# Patient Record
Sex: Female | Born: 1975 | Race: White | Hispanic: No | Marital: Married | Smoking: Never smoker
Health system: Southern US, Community
[De-identification: ages and names within clinical notes are randomized; demographics above are authoritative.]

## PROBLEM LIST (undated history)

## (undated) DIAGNOSIS — J320 Chronic maxillary sinusitis: Secondary | ICD-10-CM

## (undated) DIAGNOSIS — J45909 Unspecified asthma, uncomplicated: Secondary | ICD-10-CM

## (undated) DIAGNOSIS — R0602 Shortness of breath: Secondary | ICD-10-CM

## (undated) HISTORY — DX: Chronic maxillary sinusitis: J32.0

## (undated) HISTORY — DX: Shortness of breath: R06.02

## (undated) HISTORY — DX: Unspecified asthma, uncomplicated: J45.909

---

## 2005-03-28 ENCOUNTER — Ambulatory Visit: Payer: Self-pay | Admitting: Internal Medicine

## 2015-03-24 ENCOUNTER — Encounter: Payer: Self-pay | Admitting: Unknown Physician Specialty

## 2015-03-24 ENCOUNTER — Ambulatory Visit (INDEPENDENT_AMBULATORY_CARE_PROVIDER_SITE_OTHER): Payer: BLUE CROSS/BLUE SHIELD | Admitting: Unknown Physician Specialty

## 2015-03-24 VITALS — BP 125/74 | HR 72 | Temp 97.8°F | Ht 64.0 in | Wt 152.0 lb

## 2015-03-24 DIAGNOSIS — J45909 Unspecified asthma, uncomplicated: Secondary | ICD-10-CM | POA: Insufficient documentation

## 2015-03-24 DIAGNOSIS — J019 Acute sinusitis, unspecified: Secondary | ICD-10-CM | POA: Diagnosis not present

## 2015-03-24 DIAGNOSIS — J45902 Unspecified asthma with status asthmaticus: Secondary | ICD-10-CM

## 2015-03-24 DIAGNOSIS — Z23 Encounter for immunization: Secondary | ICD-10-CM

## 2015-03-24 DIAGNOSIS — R05 Cough: Secondary | ICD-10-CM | POA: Diagnosis not present

## 2015-03-24 DIAGNOSIS — R059 Cough, unspecified: Secondary | ICD-10-CM | POA: Insufficient documentation

## 2015-03-24 DIAGNOSIS — J329 Chronic sinusitis, unspecified: Secondary | ICD-10-CM | POA: Insufficient documentation

## 2015-03-24 DIAGNOSIS — R0602 Shortness of breath: Secondary | ICD-10-CM

## 2015-03-24 DIAGNOSIS — J320 Chronic maxillary sinusitis: Secondary | ICD-10-CM | POA: Insufficient documentation

## 2015-03-24 MED ORDER — BENZONATATE 100 MG PO CAPS: 100.0000 mg | ORAL_CAPSULE | Freq: Two times a day (BID) | ORAL | Status: DC | PRN

## 2015-03-24 MED ORDER — AMOXICILLIN-POT CLAVULANATE 875-125 MG PO TABS: 1.0000 | ORAL_TABLET | Freq: Two times a day (BID) | ORAL | Status: DC

## 2015-03-24 MED ORDER — PREDNISONE 20 MG PO TABS: 20.0000 mg | ORAL_TABLET | Freq: Every day | ORAL | Status: DC

## 2015-03-24 NOTE — Assessment & Plan Note (Signed)
Prednisone  for five days.

## 2015-03-24 NOTE — Assessment & Plan Note (Signed)
Tessalon perles to improve cough.

## 2015-03-24 NOTE — Progress Notes (Signed)
BP 125/74 mmHg  Pulse 72  Temp(Src) 97.8 F (36.6 C)  Ht  (1.626 m)  Wt 152 lb (68.947 kg)  BMI 26.08 kg/m2  SpO2 97%  LMP  (LMP Unknown)   Subjective:    Patient ID: Maria Bartlett, female    DOB: 03-31-1975, 40 y.o.   MRN: [id number]  HPI: Maria Bartlett is a 40 y.o. female  Chief Complaint  Patient presents with  . Cough    pt states she has had a cough for over a month now. Pt states she has tried taking OTC meducations but nothing has helped   Cough: non productive. Feels like has drainage "under ears." Steam room at gym improves symptoms for approx 24 hours. Not tired. Been around niece who has had ear infections and cough over Christmas. Feels like when she gets hot, cough is worse. Notices more when she is talking a lot, but otherwise no changes during day. Taken tylenol Sinus, Delsum, and Proair as needed with no improvements. Feels like can't take full breath.    Relevant past medical, surgical, family and social history reviewed and updated as indicated. Interim medical history since our last visit reviewed. Allergies and medications reviewed and updated.  Review of Systems  Constitutional: Negative.  Negative for fever.  HENT: Negative for congestion, rhinorrhea, sneezing and sore throat.   Eyes: Negative.   Respiratory: Positive for cough. Negative for chest tightness and shortness of breath.   Cardiovascular: Negative.   Gastrointestinal: Negative.   Endocrine: Negative.   Genitourinary: Negative.   Musculoskeletal: Negative.   Skin: Negative.   Allergic/Immunologic: Negative.   Neurological: Negative.   Hematological: Negative.   Psychiatric/Behavioral: Negative.     Per HPI unless specifically indicated above     Objective:    BP 125/74 mmHg  Pulse 72  Temp(Src) 97.8 F (36.6 C)  Ht  (1.626 m)  Wt 152 lb (68.947 kg)  BMI 26.08 kg/m2  SpO2 97%  LMP  (LMP Unknown)  Wt Readings from Last 3 Encounters:  03/24/15 152 lb (68.947 kg)   01/21/14 151 lb (68.493 kg)    Physical Exam  Constitutional: She is oriented to person, place, and time. She appears well-developed and well-nourished. No distress.  HENT:  Head: Normocephalic and atraumatic.  Right Ear: Tympanic membrane and ear canal normal.  Left Ear: Tympanic membrane and ear canal normal.  Nose: No rhinorrhea. Right sinus exhibits no maxillary sinus tenderness and no frontal sinus tenderness. Left sinus exhibits no maxillary sinus tenderness and no frontal sinus tenderness.  Eyes: Conjunctivae and lids are normal. Right eye exhibits no discharge. Left eye exhibits no discharge. No scleral icterus.  Cardiovascular: Normal rate and regular rhythm.   Pulmonary/Chest: Effort normal and breath sounds normal. No respiratory distress.  Abdominal: Normal appearance. There is no splenomegaly or hepatomegaly.  Musculoskeletal: Normal range of motion.  Neurological: She is alert and oriented to person, place, and time.  Skin: Skin is intact. No rash noted. No pallor.  Psychiatric: She has a normal mood and affect. Her behavior is normal. Judgment and thought content normal.    No results found for this or any previous visit.    Assessment & Plan:   Problem List Items Addressed This Visit      Unprioritized   Cough    Tessalon perles to improve cough.       Sinusitis     Prednisone  for five days.  Relevant Medications   predniSONE (DELTASONE) 20 MG tablet   amoxicillin-clavulanate (AUGMENTIN) 875-125 MG tablet   benzonatate (TESSALON) 100 MG capsule    Other Visit Diagnoses    Need for diphtheria-tetanus-pertussis (Tdap) vaccine, adult/adolescent    -  Primary    Relevant Orders    Tdap vaccine greater than or equal to 7yo IM (Completed)        Follow up plan: Return if symptoms worsen or fail to improve.

## 2015-04-07 ENCOUNTER — Other Ambulatory Visit: Payer: Self-pay | Admitting: Unknown Physician Specialty

## 2015-04-07 ENCOUNTER — Ambulatory Visit
Admission: RE | Admit: 2015-04-07 | Discharge: 2015-04-07 | Disposition: A | Discharge status: Home / Self Care | Payer: BLUE CROSS/BLUE SHIELD | Source: Ambulatory Visit | Attending: Unknown Physician Specialty | Admitting: Unknown Physician Specialty

## 2015-04-07 DIAGNOSIS — R05 Cough: Secondary | ICD-10-CM

## 2015-04-07 DIAGNOSIS — R058 Other specified cough: Secondary | ICD-10-CM

## 2017-03-22 ENCOUNTER — Ambulatory Visit
Admission: RE | Admit: 2017-03-22 | Discharge: 2017-03-22 | Disposition: A | Discharge status: Home / Self Care | Payer: BLUE CROSS/BLUE SHIELD | Source: Ambulatory Visit | Attending: Unknown Physician Specialty | Admitting: Unknown Physician Specialty

## 2017-03-22 ENCOUNTER — Other Ambulatory Visit: Payer: Self-pay | Admitting: Unknown Physician Specialty

## 2017-03-22 DIAGNOSIS — R05 Cough: Secondary | ICD-10-CM

## 2017-03-22 DIAGNOSIS — R059 Cough, unspecified: Secondary | ICD-10-CM

## 2017-05-08 ENCOUNTER — Encounter: Payer: Self-pay | Admitting: Internal Medicine

## 2017-05-08 ENCOUNTER — Ambulatory Visit: Payer: BLUE CROSS/BLUE SHIELD | Admitting: Internal Medicine

## 2017-05-08 ENCOUNTER — Institutional Professional Consult (permissible substitution): Payer: BLUE CROSS/BLUE SHIELD | Admitting: Internal Medicine

## 2017-05-08 VITALS — BP 118/62 | HR 92 | Ht 64.0 in | Wt 161.0 lb

## 2017-05-08 DIAGNOSIS — J4520 Mild intermittent asthma, uncomplicated: Secondary | ICD-10-CM

## 2017-05-08 MED ORDER — FLUTICASONE PROPIONATE HFA 220 MCG/ACT IN AERO
2.0000 | INHALATION_SPRAY | Freq: Two times a day (BID) | RESPIRATORY_TRACT | 2 refills | Status: DC
Start: 2017-05-08 — End: 2021-03-15

## 2017-05-08 NOTE — Patient Instructions (Signed)
FLOVENT 220 MCg 2 puffs twice daily when you get sick  Zyrtec 10 mg at night when you feel sick  Albuterol as needed 2-4 puffs every 4-6jhrs as needed for cough  START OMPERAZOLE daily  Check PFT's  Follow up in 2 months

## 2017-05-08 NOTE — Progress Notes (Signed)
Name: Maria Bartlett MRN: [id number] DOB: 06/05/1975     CONSULTATION DATE: 3.19.19 REFERRING MD : Jenne CampusmcQueen  CHIEF COMPLAINT: cough  STUDIES:  CXR independently reviewed -1.31.19 No acute process, no pneumonia, no opacities, no effusions   HISTORY OF PRESENT ILLNESS: 42 yo pleasant White female seen today for cough It seems that this is chronic in nature described as nonproductive This is associated with signs and symptoms of acute viral illness with chest congestion and nasal congestion Patient states she is sick with a chest cold and nasal congestion almost twice a year for her whole life Patient states that she has had her cough her whole life She is a non-smoker however she has been exposed to her father secondhand smoke exposure for 20 years  Patient also has signs symptoms of gas through esophageal reflux disease and has had acid reflux therapy in the past and it has helped her cough and she continues to have acid reflux symptoms ongoing  At this time her cough seems to be under control and resolved at this time Patient states she has had previous allergy testing in the past and is allergic to bedbugs weeds and grass  Patient works as a Psychologist, occupationalbanker in an office setting  Patient had recent upper respiratory tract infection and was given prednisone and antibiotics both have which helped Patient has not tried any over-the-counter antihistamines in the past Patient states she did try albuterol inhaler and it does seem to help her breathing and her cough Patient currently weighs 161 pounds in approximately 5 years ago she was 150  Patient states that she has no signs and symptoms of excessive daytime sleepiness however her snoring has increased over the last year patient does have intermittent wheezing with exercise every now and then    PAST MEDICAL HISTORY :   has a past medical history of Asthma, Chronic maxillary sinusitis, and SOB (shortness of breath).  has no past  surgical history on file. Prior to Admission medications   Medication Sig Start Date End Date Taking? Authorizing Provider  albuterol (PROVENTIL HFA;VENTOLIN HFA) 108 (90 Base) MCG/ACT inhaler Inhale 2 puffs into the lungs every 6 (six) hours as needed for wheezing or shortness of breath.   Yes [provider]  JUNEL FE 1/20 1-20 MG-MCG tablet Take 1 tablet by mouth daily. 03/22/15  Yes [provider]   Allergies  Allergen Reactions  . Other   . Pollen Extract    NO SURGICAL HISTORY Has had nasal scope by ENT in the past  FAMILY HISTORY:  family history includes Arthritis in her father, maternal grandmother, and mother; Cancer in her father; Diabetes in her paternal grandfather; Endometriosis in her mother; Heart disease in her father and paternal grandfather; Hyperlipidemia in her father and mother; Hypertension in her mother; Meniere's disease in her mother; Osteoporosis in her maternal grandmother; Stroke in her maternal grandfather. SOCIAL HISTORY:  reports that  has never smoked. she has never used smokeless tobacco. She reports that she does not drink alcohol or use drugs.  REVIEW OF SYSTEMS:   Constitutional: Negative for fever, chills, weight loss, malaise/fatigue and diaphoresis.  HENT: Negative for hearing loss, ear pain, nosebleeds, congestion, sore throat, neck pain, tinnitus and ear discharge.   Eyes: Negative for blurred vision, double vision, photophobia, pain, discharge and redness.  Respiratory:+ cough, hemoptysis, sputum production, shortness of breath, wheezing and stridor.   Cardiovascular: Negative for chest pain, palpitations, orthopnea, claudication, leg swelling and PND.  Gastrointestinal: Negative  for heartburn, nausea, vomiting, abdominal pain, diarrhea, constipation, blood in stool and melena.  Genitourinary: Negative for dysuria, urgency, frequency, hematuria and flank pain.  Musculoskeletal: Negative for myalgias, back pain, joint pain and  falls.  Skin: Negative for itching and rash.  Neurological: Negative for dizziness, tingling, tremors, sensory change, speech change, focal weakness, seizures, loss of consciousness, weakness and headaches.  Endo/Heme/Allergies: Negative for environmental allergies and polydipsia. Does not bruise/bleed easily.  ALL OTHER ROS ARE NEGATIVE  BP 118/62 (BP Location: Left Arm, Cuff Size: Normal)   Pulse 92   Ht 5\' 4"  (1.626 m)   Wt 161 lb (73 kg)   SpO2 97%   BMI 27.64 kg/m   Physical Examination:   GENERAL:NAD, no fevers, chills, no weakness no fatigue HEAD: Normocephalic, atraumatic.  EYES: Pupils equal, round, reactive to light. Extraocular muscles intact. No scleral icterus.  MOUTH: Moist mucosal membrane.   EAR, NOSE, THROAT: Clear without exudates. No external lesions.  NECK: Supple. No thyromegaly. No nodules. No JVD.  PULMONARY:CTA B/L no wheezes, no crackles, no rhonchi CARDIOVASCULAR: S1 and S2. Regular rate and rhythm. No murmurs, rubs, or gallops. No edema.  GASTROINTESTINAL: Soft, nontender, nondistended. No masses. Positive bowel sounds.  MUSCULOSKELETAL: No swelling, clubbing, or edema. Range of motion full in all extremities.  NEUROLOGIC: Cranial nerves II through XII are intact. No gross focal neurological deficits.  SKIN: No ulceration, lesions, rashes, or cyanosis. Skin warm and dry. Turgor intact.  PSYCHIATRIC: Mood, affect within normal limits. The patient is awake, alert and oriented x 3. Insight, judgment intact.      ASSESSMENT / PLAN: 42 year old pleasant white female seen today for chronic persistent cough Patient may have underlying cough variant asthma which is triggered by acute upper respiratory tract infections with viral sickness with a lingering postviral nonproductive cough that last several months which happens almost twice a year in the setting of uncontrolled gastro esophageal reflux disease  I have explained to her that patient will need inhaled  steroids and will prescribe Flovent 220 mcg puffs 2 drops twice daily along with albuterol inhaler 2-4 puffs every 4-6 hours in conjunction with over-the-counter antihistamine therapy with Zyrtec every time she has  these upper respiratory tract infections.  Patient seems to understand the plan of action and is satisfied with the visit I will also obtain pulmonary function testing to assess for underlying obstructive lung disease   Plan for acute viral illness or acute reactive airways disease #1 Flovent 220 upg 2 puffs twice daily #2 albuterol inhaler 2-4 puffs every 4-6 hours as needed #3 Zyrtec 10 mg daily as needed #4 patient will need to take PPI indefinitely at this time #5 avoid sick contacts  Follow up in 3 months with PFT's  Patient satisfied with Plan of action and management. All questions answered  Lucie Leather, M.D.  [facility]  Medical Director [facility] Medical Director [facility] Cardio-Pulmonary Department

## 2017-06-26 ENCOUNTER — Ambulatory Visit: Payer: BLUE CROSS/BLUE SHIELD | Attending: Internal Medicine

## 2017-06-26 DIAGNOSIS — J4520 Mild intermittent asthma, uncomplicated: Secondary | ICD-10-CM | POA: Diagnosis not present

## 2017-07-02 ENCOUNTER — Ambulatory Visit: Payer: BLUE CROSS/BLUE SHIELD | Admitting: Internal Medicine

## 2017-07-05 ENCOUNTER — Telehealth: Payer: Self-pay | Admitting: Internal Medicine

## 2017-07-05 ENCOUNTER — Ambulatory Visit: Payer: BLUE CROSS/BLUE SHIELD | Admitting: Internal Medicine

## 2017-07-05 NOTE — Telephone Encounter (Signed)
Patient ran over for a meeting at work and cannot come for ov.  Patient would like someone to go over pft results via phone and advise if she even needs an ov .

## 2017-07-06 NOTE — Telephone Encounter (Signed)
Pt advised that she needs to reschedule office visit when able. Nothing further needed.

## 2017-07-16 ENCOUNTER — Encounter: Payer: Self-pay | Admitting: Emergency Medicine

## 2017-07-16 ENCOUNTER — Other Ambulatory Visit: Payer: Self-pay

## 2017-07-16 ENCOUNTER — Ambulatory Visit
Admission: EM | Admit: 2017-07-16 | Discharge: 2017-07-16 | Disposition: A | Discharge status: Home / Self Care | Payer: BLUE CROSS/BLUE SHIELD | Attending: Family Medicine | Admitting: Family Medicine

## 2017-07-16 DIAGNOSIS — R21 Rash and other nonspecific skin eruption: Secondary | ICD-10-CM

## 2017-07-16 DIAGNOSIS — L255 Unspecified contact dermatitis due to plants, except food: Secondary | ICD-10-CM

## 2017-07-16 MED ORDER — PREDNISONE 10 MG PO TABS: ORAL_TABLET | ORAL | 0 refills | Status: DC

## 2017-07-16 MED ORDER — PREDNISONE 50 MG PO TABS
60.0000 mg | ORAL_TABLET | Freq: Every day | ORAL | Status: DC
  Administered 2017-07-16: 60 mg via ORAL

## 2017-07-16 NOTE — ED Triage Notes (Signed)
[place] may have come in contact with poison ivy. Rash to bilateral legs, left wrist and right arm. Denies pain but states rash is very itchy. Using Cortisone cream and Benadryl PO with no relief.

## 2017-07-16 NOTE — ED Provider Notes (Signed)
[facility] ____________________________________________  Time seen: Approximately 7:01 PM  I have reviewed the triage vital signs and the nursing notes.   HISTORY  Chief Complaint Poison Ivy  HPI Maria Bartlett is a 42 y.o. female presenting for evaluation of itchy rash present for last 5 days. States rash started after working outside, pulling weeds and believes may have come in contact with poison oak or ivy.  Denies any detergents or changes in foods, medicines, lotions, detergents or other contacts.  States rash first started to the left wrist and has since spread to both arms states has been applying topical over-the-counter medications including calamine and taking oral benadryl without resolution.  Patient reports otherwise feels well.  Denies other aggravating or alleviating factors.Denies recent sickness. Denies recent antibiotic use.   Steele Sizer, MD: PCP No LMP recorded (lmp unknown). (Menstrual status: Oral contraceptives).Denies pregnancy.    Past Medical History:  Diagnosis Date  . Asthma   . Chronic maxillary sinusitis   . SOB (shortness of breath)     Patient Active Problem List   Diagnosis Date Noted  . SOB (shortness of breath) 03/24/2015  . Asthma 03/24/2015  . Chronic maxillary sinusitis 03/24/2015  . Cough 03/24/2015  . Sinusitis 03/24/2015    History reviewed. No pertinent surgical history.    Current Facility-Administered Medications:  .  [START ON 07/17/2017] predniSONE (DELTASONE) tablet 60 mg, 60 mg, Oral, Q breakfast, Renford Dills, NP, 60 mg at 07/16/17 1758  Current Outpatient Medications:  .  albuterol (PROVENTIL HFA;VENTOLIN HFA) 108 (90 Base) MCG/ACT inhaler, Inhale 2 puffs into the lungs every 6 (six) hours as needed for wheezing or shortness of breath., Disp: , Rfl:  .  fluticasone (FLOVENT HFA) 220 MCG/ACT inhaler, Inhale 2 puffs into the lungs 2 (two) times daily., Disp: 1 Inhaler, Rfl: 2 .  JUNEL FE 1/20 1-20  MG-MCG tablet, Take 1 tablet by mouth daily., Disp: , Rfl:  .  predniSONE (DELTASONE) 10 MG tablet, Take  orally day one, then 55 mg orally day two, then 50 mg orally day three, then taper by 5 mg daily over 12 days until complete., Disp: 35 tablet, Rfl: 0  Allergies Other and Pollen extract  Family History  Problem Relation Age of Onset  . Cancer Father        prostate  . Hyperlipidemia Father   . Heart disease Father   . Arthritis Father   . Hypertension Mother   . Arthritis Mother   . Meniere's disease Mother   . Hyperlipidemia Mother   . Endometriosis Mother   . Osteoporosis Maternal Grandmother   . Arthritis Maternal Grandmother   . Stroke Maternal Grandfather   . Diabetes Paternal Grandfather   . Heart disease Paternal Grandfather        MI    Social History Social History   Tobacco Use  . Smoking status: Never Smoker  . Smokeless tobacco: Never Used  Substance Use Topics  . Alcohol use: No    Alcohol/week: 0.0 oz  . Drug use: No    Review of Systems Constitutional: No fever/chills Cardiovascular: Denies chest pain. Respiratory: Denies shortness of breath. Gastrointestinal: No abdominal pain.   Musculoskeletal: Negative for back pain. Skin: as above.   ____________________________________________   PHYSICAL EXAM:  VITAL SIGNS: ED Triage Vitals  Enc Vitals Group     BP 07/16/17 1737 110/61     Pulse Rate 07/16/17 1737 72     Resp 07/16/17 1737 16  Temp 07/16/17 1737 98.3 F (36.8 C)     Temp Source 07/16/17 1737 Oral     SpO2 07/16/17 1737 100 %     Weight 07/16/17 1733 158 lb (71.7 kg)     Height 07/16/17 1733  (1.626 m)     Head Circumference --      Peak Flow --      Pain Score 07/16/17 1733 0     Pain Loc --      Pain Edu? --      Excl. in GC? --     Constitutional: Alert and oriented. Well appearing and in no acute distress. ENT      Head: Normocephalic and atraumatic. Cardiovascular: Normal rate, regular rhythm. Grossly  normal heart sounds.  Good peripheral circulation. Respiratory: Normal respiratory effort without tachypnea nor retractions. Breath sounds are clear and equal bilaterally. No wheezes, rales, rhonchi. Musculoskeletal: Steady gait.  Neurologic:  Normal speech and language.  Speech is normal. No gait instability.  Skin:  Skin is warm, dry. Except: Pruritic vesicular minimally erythematous rash present to bilateral forearms and bilateral inner thighs, no surrounding erythema, induration or drainage. Psychiatric: Mood and affect are normal. Speech and behavior are normal. Patient exhibits appropriate insight and judgment   ___________________________________________   LABS (all labs ordered are listed, but only abnormal results are displayed)  Labs Reviewed - No data to display ____________________________________________  PROCEDURES Procedures    INITIAL IMPRESSION / ASSESSMENT AND PLAN / ED COURSE  Pertinent labs & imaging results that were available during my care of the patient were reviewed by me and considered in my medical decision making (see chart for details).  Well-appearing patient.  No acute distress.  Rash clinical appearance consistent with plant contact dermatitis.  Will treat with prednisone taper.  Encourage rest, fluids, supportive care and avoidance of trigger.Discussed indication, risks and benefits of medications with patient.  Discussed follow up with Primary care physician this week. Discussed follow up and return parameters including no resolution or any worsening concerns. Patient verbalized understanding and agreed to plan.   ____________________________________________   FINAL CLINICAL IMPRESSION(S) / ED DIAGNOSES  Final diagnoses:  Contact dermatitis due to plant     ED Discharge Orders        Ordered    predniSONE (DELTASONE) 10 MG tablet     07/16/17 1754       Note: This dictation was prepared with Dragon dictation along with smaller phrase  technology. Any transcriptional errors that result from this process are unintentional.         Renford Dills, NP 07/16/17 1914

## 2017-07-16 NOTE — Discharge Instructions (Addendum)
Take medication as prescribed. Drink plenty of fluids. Avoid trigger. ° °Follow up with your primary care physician this week as needed. Return to Urgent care for new or worsening concerns.  ° °

## 2017-08-06 ENCOUNTER — Ambulatory Visit: Payer: BLUE CROSS/BLUE SHIELD | Admitting: Internal Medicine

## 2017-08-06 ENCOUNTER — Encounter: Payer: Self-pay | Admitting: Internal Medicine

## 2017-08-06 VITALS — BP 110/68 | HR 87 | Ht 64.0 in | Wt 158.0 lb

## 2017-08-06 DIAGNOSIS — J4520 Mild intermittent asthma, uncomplicated: Secondary | ICD-10-CM

## 2017-08-06 NOTE — Progress Notes (Signed)
   Name: Maria Bartlett MRN: [id number] DOB: 1975/07/18     CONSULTATION DATE: 3.19.19 REFERRING MD : Jenne CampusmcQueen  CHIEF COMPLAINT: cough  STUDIES:  CXR independently revBernestine Amassiewed -1.31.19 No acute process, no pneumonia, no opacities, no effusions   HISTORY OF PRESENT ILLNESS: Follow up cough and wheezing resolved No acute issues at this time combination of  Flovent/zyrtec has significantly helped her symptoms Second hand smoke exposure for years    REVIEW OF SYSTEMS:   Constitutional: Negative for fever, chills, weight loss, malaise/fatigue and diaphoresis.  HENT: Negative for hearing loss, ear pain, nosebleeds, congestion, sore throat, neck pain, tinnitus and ear discharge.   Eyes: Negative for blurred vision, double vision, photophobia, pain, discharge and redness.  Respiratory:-cough, hemoptysis, sputum production, shortness of breath, -wheezing and stridor.   Cardiovascular: Negative for chest pain, palpitations, orthopnea, claudication, leg swelling and PND.  ALL OTHER ROS ARE NEGATIVE  BP 110/68 (BP Location: Left Arm, Cuff Size: Normal)   Pulse 87   Ht 5\' 4"  (1.626 m)   Wt 158 lb (71.7 kg)   LMP  (LMP Unknown)   SpO2 91%   BMI 27.12 kg/m   Physical Examination:   GENERAL:NAD, no fevers, chills, no weakness no fatigue HEAD: Normocephalic, atraumatic.  EYES: Pupils equal, round, reactive to light. Extraocular muscles intact. No scleral icterus.  MOUTH: Moist mucosal membrane.   EAR, NOSE, THROAT: Clear without exudates. No external lesions.  NECK: Supple. No thyromegaly. No nodules. No JVD.  PULMONARY:CTA B/L no wheezes, no crackles, no rhonchi CARDIOVASCULAR: S1 and S2. Regular rate and rhythm. No murmurs, rubs, or gallops. No edema.  GASTROINTESTINAL: Soft, nontender, nondistended. No masses. Positive bowel sounds.  MUSCULOSKELETAL: No swelling, clubbing, or edema. Range of motion full in all extremities.  NEUROLOGIC: Cranial nerves II through XII are intact. No  gross focal neurological deficits.  SKIN: No ulceration, lesions, rashes, or cyanosis. Skin warm and dry. Turgor intact.  PSYCHIATRIC: Mood, affect within normal limits. The patient is awake, alert and oriented x 3. Insight, judgment intact.      ASSESSMENT / PLAN: [age] year old pleasant white female seen today for follow up chronic persistent cough Patient may have underlying cough variant asthma which is triggered by acute upper respiratory tract infections with viral sickness with a lingering postviral nonproductive cough that last several months which happens almost twice a year in the setting of uncontrolled gastro esophageal reflux disease  Her symptoms have resolved with Flovent/Albuterol and Zyrtec as needed Her PFT's do NOT show any evidence of obstructive or restrictive lung disease  Plan for acute viral illness or acute reactive airways disease #1 Flovent 220 upg 2 puffs twice daily #2 albuterol inhaler 2-4 puffs every 4-6 hours as needed #3 Zyrtec 10 mg daily as needed #4 patient will need to take PPI indefinitely at this time #5 avoid sick contacts  Follow up as needed  Patient satisfied with Plan of action and management. All questions answered  Lucie LeatherKurian David Dionisio Aragones, M.D.  [facility]  Medical Director [facility] Medical Director [facility]

## 2017-08-06 NOTE — Patient Instructions (Addendum)
Use inhalers and zyrtec as needed  Follow up as needed

## 2017-08-15 ENCOUNTER — Telehealth: Payer: Self-pay | Admitting: Internal Medicine

## 2017-08-15 NOTE — Telephone Encounter (Signed)
Pt advised to d/c inhaler for 3 days then call back with report. Nothing further needed

## 2017-08-15 NOTE — Telephone Encounter (Signed)
Patient saw Dr. Belia HemanKasa on 6/17 States that two days after she got sick States she feels fine and does not have a cough but she has lost her voice Is wondering if maybe it is a side effect of the Flovent Please call to discuss

## 2019-01-31 ENCOUNTER — Other Ambulatory Visit: Payer: Self-pay

## 2019-01-31 DIAGNOSIS — Z20822 Contact with and (suspected) exposure to covid-19: Secondary | ICD-10-CM

## 2019-02-01 LAB — NOVEL CORONAVIRUS, NAA: SARS-CoV-2, NAA: NOT DETECTED

## 2019-03-19 IMAGING — CR DG CHEST 2V
1 series · 2 of 2 positions shown · non-contrast
Comparison: 04/07/2015.

CLINICAL DATA: Dry cough.

EXAM:
CHEST  2 VIEW

[Series 1: dg chest 2 view · 0.14mm/px · 2 of 2 slices shown]
[im 1/2]
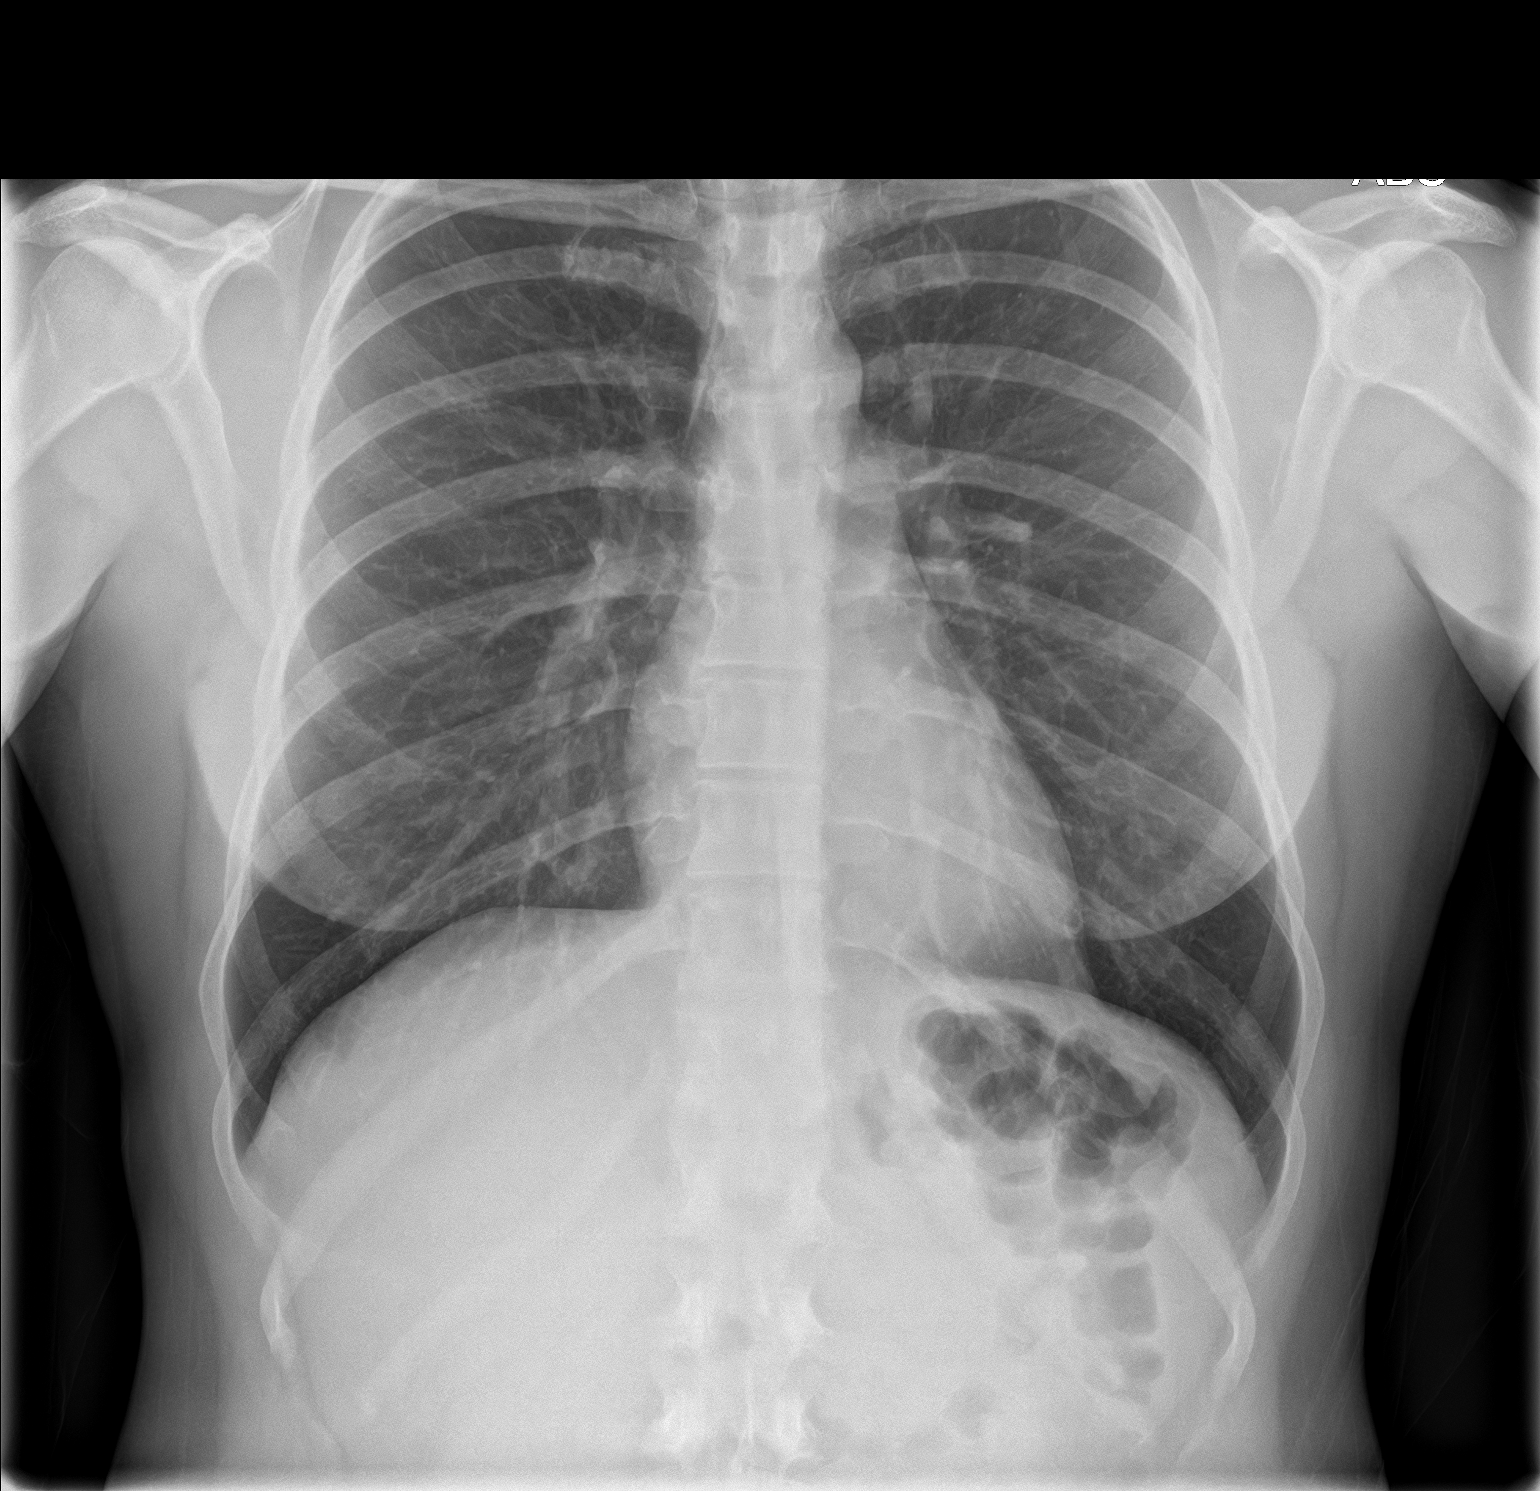
[im 2/2]
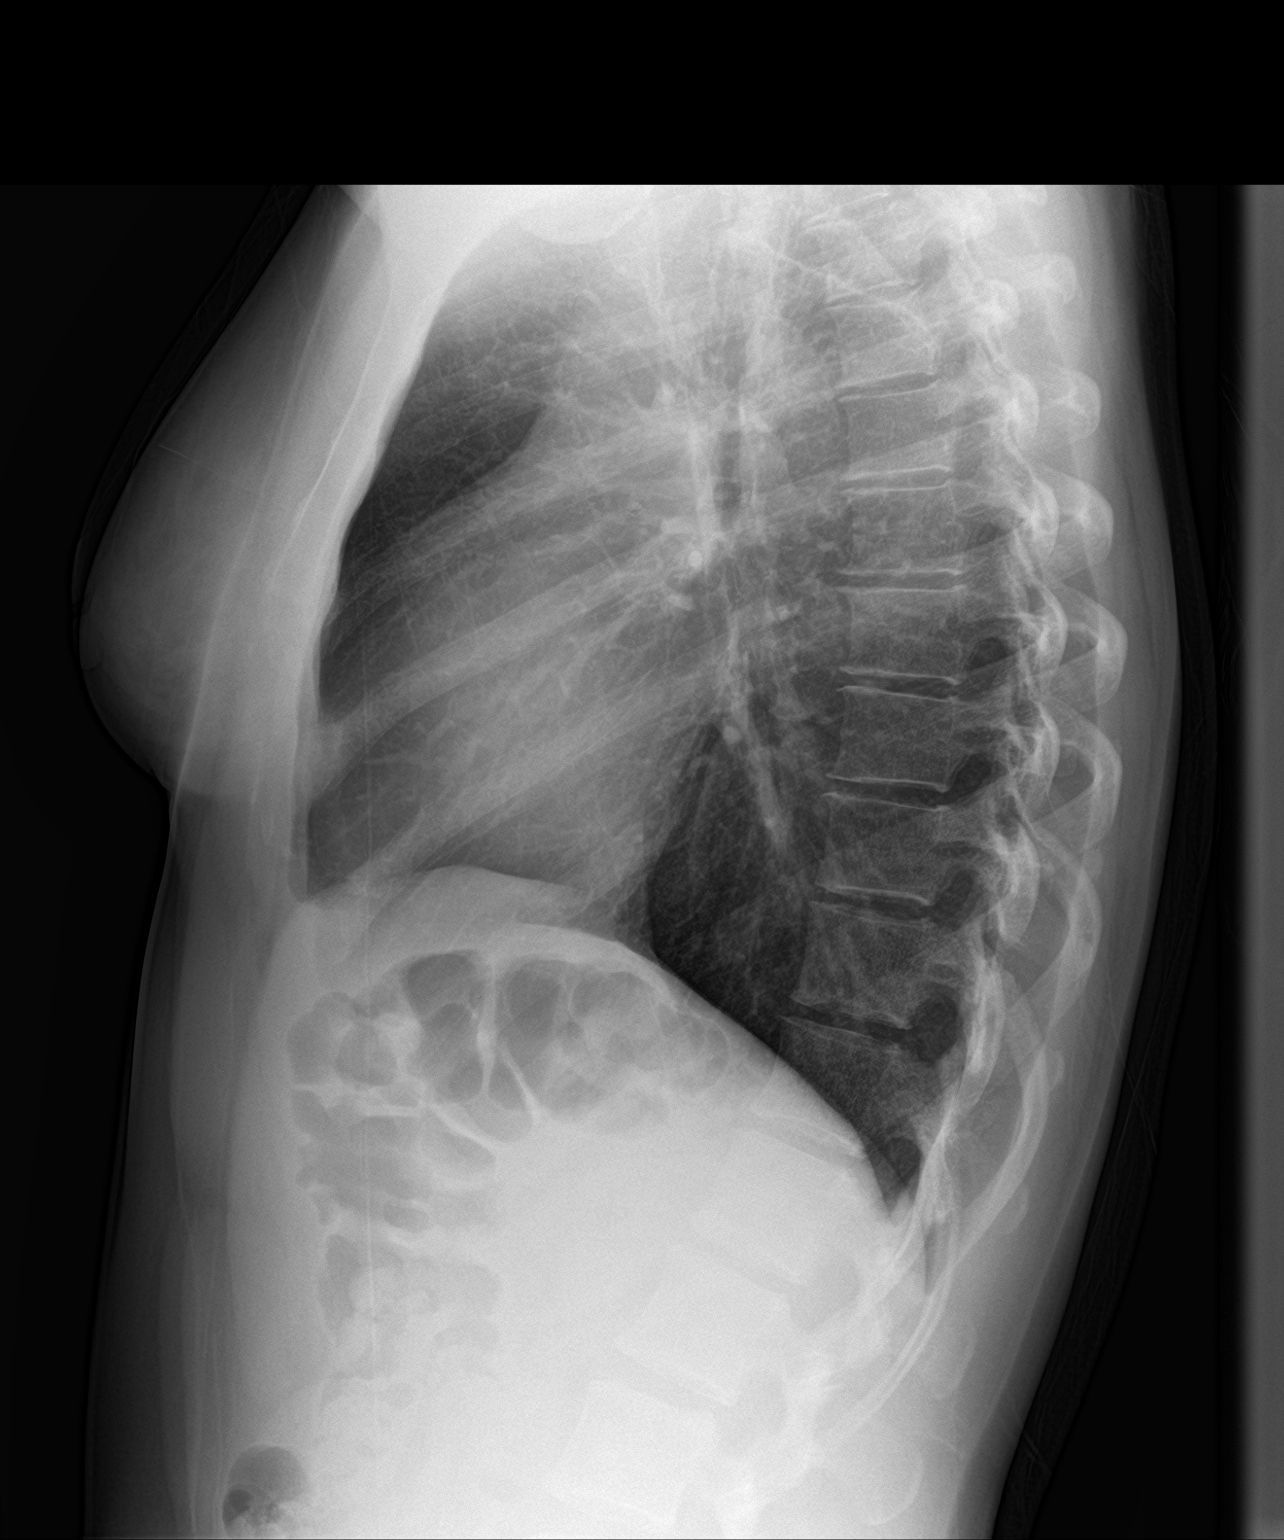

[2 of 2 positions shown; findings below may reference images not displayed]

FINDINGS: Mediastinum and hilar structures normal. Lungs are clear. No pleural
effusion or pneumothorax. Heart size normal. No acute bony
abnormality.
IMPRESSION: No acute abnormality.

## 2021-03-15 ENCOUNTER — Encounter: Payer: Self-pay | Admitting: Internal Medicine

## 2021-03-15 ENCOUNTER — Other Ambulatory Visit: Payer: Self-pay

## 2021-03-15 ENCOUNTER — Ambulatory Visit: Payer: BLUE CROSS/BLUE SHIELD | Admitting: Internal Medicine

## 2021-03-15 VITALS — BP 98/60 | HR 73 | Temp 97.7°F | Resp 17 | Ht 64.0 in | Wt 153.6 lb

## 2021-03-15 DIAGNOSIS — E663 Overweight: Secondary | ICD-10-CM

## 2021-03-15 DIAGNOSIS — Z6826 Body mass index (BMI) 26.0-26.9, adult: Secondary | ICD-10-CM

## 2021-03-15 DIAGNOSIS — J4520 Mild intermittent asthma, uncomplicated: Secondary | ICD-10-CM

## 2021-03-15 DIAGNOSIS — Z1159 Encounter for screening for other viral diseases: Secondary | ICD-10-CM | POA: Diagnosis not present

## 2021-03-15 DIAGNOSIS — Z0001 Encounter for general adult medical examination with abnormal findings: Secondary | ICD-10-CM

## 2021-03-15 NOTE — Progress Notes (Signed)
HPI  Patient presents to clinic today to establish care. She would like her annual exam.  Asthma: Currently not an issue. She is not using any inhalers at this time. There are no PFT's on file.  Flu: never Tetanus: 03/2015 COVID: never Pap smear: 08/2016 Mammogram: 03/2019 Vision screening: scheduled Dentist: biannually  Diet: She does eat meat. She consumes fruits and veggies. She does eat some fried foods. She drinks mostly water, 1 pepsi/tea. Exercise: Pel[employer], workout videos, walking  Past Medical History:  Diagnosis Date   Asthma    Chronic maxillary sinusitis    SOB (shortness of breath)     No current outpatient medications on file.   No current facility-administered medications for this visit.    Allergies  Allergen Reactions   Other    Pollen Extract     Family History  Problem Relation Age of Onset   Cancer Father        prostate   Hyperlipidemia Father    Heart disease Father    Arthritis Father    Hypertension Mother    Arthritis Mother    Meniere's disease Mother    Hyperlipidemia Mother    Endometriosis Mother    Osteoporosis Maternal Grandmother    Arthritis Maternal Grandmother    Stroke Maternal Grandfather    Diabetes Paternal Grandfather    Heart disease Paternal Grandfather        MI    Social History   Socioeconomic History   Marital status: Married    Spouse name: Not on file   Number of children: Not on file   Years of education: Not on file   Highest education level: Not on file  Occupational History   Not on file  Tobacco Use   Smoking status: Never   Smokeless tobacco: Never  Vaping Use   Vaping Use: Never used  Substance and Sexual Activity   Alcohol use: No    Alcohol/week: 0.0 standard drinks   Drug use: No   Sexual activity: Yes    Birth control/protection: Pill  Other Topics Concern   Not on file  Social History Narrative   Not on file   Social Determinants of Health   Financial Resource Strain: Not on file   Food Insecurity: Not on file  Transportation Needs: Not on file  Physical Activity: Not on file  Stress: Not on file  Social Connections: Not on file  Intimate Partner Violence: Not on file    ROS:  Constitutional: Denies fever, malaise, fatigue, headache or abrupt weight changes.  HEENT: Denies eye pain, eye redness, ear pain, ringing in the ears, wax buildup, runny nose, nasal congestion, bloody nose, or sore throat. Respiratory: Denies difficulty breathing, shortness of breath, cough or sputum production.   Cardiovascular: Denies chest pain, chest tightness, palpitations or swelling in the hands or feet.  Gastrointestinal: Patient reports intermittent constipation.  Denies abdominal pain, bloating, diarrhea or blood in the stool.  GU: Patient reports painful intercourse.  Denies frequency, urgency, pain with urination, blood in urine, odor or discharge. Musculoskeletal: Denies decrease in range of motion, difficulty with gait, muscle pain or joint pain and swelling.  Skin: Denies redness, rashes, lesions or ulcercations.  Neurological: Denies dizziness, difficulty with memory, difficulty with speech or problems with balance and coordination.  Psych: Denies anxiety, depression, SI/HI.  No other specific complaints in a complete review of systems (except as listed in HPI above).  PE:  BP 98/60 (BP Location: Left Arm, Patient Position: Sitting, Cuff  Size: Normal)    Pulse 73    Temp 97.7 F (36.5 C) (Temporal)    Resp 17    Ht 5' 4"  (1.626 m)    Wt 153 lb 9.6 oz (69.7 kg)    LMP 03/06/2021 (Approximate)    SpO2 100%    BMI 26.37 kg/m  Wt Readings from Last 3 Encounters:  03/15/21 153 lb 9.6 oz (69.7 kg)  08/06/17 158 lb (71.7 kg)  07/16/17 158 lb (71.7 kg)    General: Appears her stated age, overweight, in NAD. HEENT: Head: normal shape and size; Eyes: sclera white and EOMs intact;  Neck: Neck supple, trachea midline. No masses, lumps or thyromegaly present.  Cardiovascular:  Normal rate and rhythm. S1,S2 noted.  No murmur, rubs or gallops noted. No JVD or BLE edema.  Pulmonary/Chest: Normal effort and positive vesicular breath sounds. No respiratory distress. No wheezes, rales or ronchi noted.  Abdomen: Soft and nontender. Normal bowel sounds. Musculoskeletal: Strength 5/5 BUE/BLE. No difficulty with gait.  Neurological: Alert and oriented. Cranial nerves II-XII grossly intact. Coordination normal.  Psychiatric: Mood and affect normal. Behavior is normal. Judgment and thought content normal.    Assessment and Plan:  Preventative Health Maintenance:  She declines flu shot Tetanus UTD She declines COVID-vaccine Pap smear due, she will schedule an appointment to have this done We will start screening mammograms at 48 Encouraged her to consume a balanced diet and exercise regimen Advised her to see an eye doctor and dentist annually We will check CBC, c-Met, lipid, A1c and hep C today  RTC in 1 year for your annual exam Webb Silversmith, NP This visit occurred during the SARS-CoV-2 public health emergency.  Safety protocols were in place, including screening questions prior to the visit, additional usage of staff PPE, and extensive cleaning of exam room while observing appropriate contact time as indicated for disinfecting solutions.

## 2021-03-15 NOTE — Patient Instructions (Signed)

## 2021-03-15 NOTE — Assessment & Plan Note (Signed)
Encourage diet and exercise for weight loss 

## 2021-03-15 NOTE — Assessment & Plan Note (Signed)
Currently not an issue Will monitor 

## 2021-03-16 LAB — COMPLETE METABOLIC PANEL WITH GFR
AG Ratio: 1.8 (calc) (ref 1.0–2.5)
ALT: 15 U/L (ref 6–29)
AST: 19 U/L (ref 10–30)
Albumin: 4.2 g/dL (ref 3.6–5.1)
Alkaline phosphatase (APISO): 43 U/L (ref 31–125)
BUN/Creatinine Ratio: 12 (calc) (ref 6–22)
BUN: 12 mg/dL (ref 7–25)
CO2: 30 mmol/L (ref 20–32)
Calcium: 9.3 mg/dL (ref 8.6–10.2)
Chloride: 105 mmol/L (ref 98–110)
Creat: 1.03 mg/dL — ABNORMAL HIGH (ref 0.50–0.99)
Globulin: 2.3 g/dL (calc) (ref 1.9–3.7)
Glucose, Bld: 84 mg/dL (ref 65–99)
Potassium: 4.5 mmol/L (ref 3.5–5.3)
Sodium: 138 mmol/L (ref 135–146)
Total Bilirubin: 0.9 mg/dL (ref 0.2–1.2)
Total Protein: 6.5 g/dL (ref 6.1–8.1)
eGFR: 69 mL/min/{1.73_m2} (ref >=60)

## 2021-03-16 LAB — CBC
HCT: 39.2 % (ref 35.0–45.0)
Hemoglobin: 13.2 g/dL (ref 11.7–15.5)
MCH: 30.9 pg (ref 27.0–33.0)
MCHC: 33.7 g/dL (ref 32.0–36.0)
MCV: 91.8 fL (ref 80.0–100.0)
MPV: 9.5 fL (ref 7.5–12.5)
Platelets: 342 10*3/uL (ref 140–400)
RBC: 4.27 10*6/uL (ref 3.80–5.10)
RDW: 12.1 % (ref 11.0–15.0)
WBC: 7.7 10*3/uL (ref 3.8–10.8)

## 2021-03-16 LAB — HEMOGLOBIN A1C
Hgb A1c MFr Bld: 5.2 % of total Hgb (ref <5.7)
Mean Plasma Glucose: 103 mg/dL
eAG (mmol/L): 5.7 mmol/L

## 2021-03-16 LAB — HEPATITIS C ANTIBODY
Hepatitis C Ab: NONREACTIVE
SIGNAL TO CUT-OFF: 0.07 (ref <1.00)

## 2021-03-16 LAB — LIPID PANEL
Cholesterol: 171 mg/dL (ref <200)
HDL: 42 mg/dL — ABNORMAL LOW (ref >=50)
LDL Cholesterol (Calc): 106 mg/dL (calc) — ABNORMAL HIGH
Non-HDL Cholesterol (Calc): 129 mg/dL (calc) (ref <130)
Total CHOL/HDL Ratio: 4.1 (calc) (ref <5.0)
Triglycerides: 126 mg/dL (ref <150)

## 2021-03-25 ENCOUNTER — Ambulatory Visit (INDEPENDENT_AMBULATORY_CARE_PROVIDER_SITE_OTHER): Payer: BLUE CROSS/BLUE SHIELD | Admitting: Internal Medicine

## 2021-03-25 ENCOUNTER — Encounter: Payer: Self-pay | Admitting: Internal Medicine

## 2021-03-25 ENCOUNTER — Other Ambulatory Visit: Payer: Self-pay

## 2021-03-25 ENCOUNTER — Other Ambulatory Visit (HOSPITAL_COMMUNITY)
Admission: RE | Admit: 2021-03-25 | Discharge: 2021-03-25 | Disposition: A | Discharge status: Home / Self Care | Payer: BC Managed Care – PPO | Source: Ambulatory Visit | Attending: Internal Medicine | Admitting: Internal Medicine

## 2021-03-25 VITALS — BP 110/62 | HR 67 | Temp 97.5°F | Wt 153.0 lb

## 2021-03-25 DIAGNOSIS — Z01419 Encounter for gynecological examination (general) (routine) without abnormal findings: Secondary | ICD-10-CM | POA: Insufficient documentation

## 2021-03-25 DIAGNOSIS — Z124 Encounter for screening for malignant neoplasm of cervix: Secondary | ICD-10-CM | POA: Insufficient documentation

## 2021-03-25 NOTE — Patient Instructions (Signed)
Pap Test °Why am I having this test? °A Pap test, also called a Pap smear, is a screening test to check for signs of: °Infection. °Cancer of the cervix. The cervix is the lower part of the uterus that opens into the vagina. °Changes that may be a sign that cancer is developing (precancerous changes). °Women need this test on a regular basis. In general, you should have a Pap test every 3 years until you reach menopause or age 44. Women aged 30-60 may choose to have their Pap test done at the same time as an HPV (human papillomavirus) test every 5 years (instead of every 3 years). °Your health care provider may recommend having Pap tests more or less often depending on your medical conditions and past Pap test results. °What is being tested? °Cervical cells are tested for signs of infection or abnormalities. °What kind of sample is taken? °Your health care provider will collect a sample of cells from the surface of your cervix. This will be done using a small cotton swab, plastic spatula, or brush that is inserted into your vagina using a tool called a speculum. This sample is often collected during a pelvic exam, when you are lying on your back on an exam table with your feet in footrests (stirrups). In some cases, fluids (secretions) from the cervix or vagina may also be collected. °How do I prepare for this test? °Be aware of where you are in your menstrual cycle. If you are menstruating on the day of the test, you may be asked to reschedule. °You may need to reschedule if you have a known vaginal infection on the day of the test. °Follow instructions from your health care provider about: °Changing or stopping your regular medicines. Some medicines can cause abnormal test results, such as vaginal medicines and tetracycline. °Avoiding douching 2-3 days before or the day of the test. °Tell a health care provider about: °Any allergies you have. °All medicines you are taking, including vitamins, herbs, eye drops,  creams, and over-the-counter medicines. °Any bleeding problems you have. °Any surgeries you have had. °Any medical conditions you have. °Whether you are pregnant or may be pregnant. °How are the results reported? °Your test results will be reported as either abnormal or normal. °What do the results mean? °A normal test result means that you do not have signs of cancer of the cervix. °An abnormal result may mean that you have: °Cancer. A Pap test by itself is not enough to diagnose cancer. You will have more tests done if cancer is suspected. °Precancerous changes in your cervix. °Inflammation of the cervix. °An STI (sexually transmitted infection). °A fungal infection. °A parasite infection. °Talk with your health care provider about what your results mean. In some cases, your health care provider may do more testing to confirm the results. °Questions to ask your health care provider °Ask your health care provider, or the department that is doing the test: °When will my results be ready? °How will I get my results? °What are my treatment options? °What other tests do I need? °What are my next steps? °Summary °In general, women should have a Pap test every 3 years until they reach menopause or age 44. °Your health care provider will collect a sample of cells from the surface of your cervix. This will be done using a small cotton swab, plastic spatula, or brush. °In some cases, fluids (secretions) from the cervix or vagina may also be collected. °This information is not intended   to replace advice given to you by your health care provider. Make sure you discuss any questions you have with your health care provider. °Document Revised: 05/07/2020 Document Reviewed: 05/07/2020 °Elsevier Patient Education © 2022 [employer] ° °

## 2021-03-25 NOTE — Progress Notes (Signed)
Subjective:    Patient ID: Maria Bartlett, female    DOB: 03-Dec-1975, 46 y.o.   MRN: [id number]  HPI  Patient presents to clinic today for Pap smear.  Her last Pap smear was 08/2016. She does continue to have regular periods. She has no concerns about STD's.  Review of Systems     Past Medical History:  Diagnosis Date   Asthma     No current outpatient medications on file.   No current facility-administered medications for this visit.    Allergies  Allergen Reactions   Other    Pollen Extract     Family History  Problem Relation Age of Onset   Cancer Father        prostate   Hyperlipidemia Father    Heart disease Father    Arthritis Father    Hypertension Mother    Arthritis Mother    Meniere's disease Mother    Hyperlipidemia Mother    Endometriosis Mother    Osteoporosis Maternal Grandmother    Arthritis Maternal Grandmother    Stroke Maternal Grandfather    Diabetes Paternal Grandfather    Heart disease Paternal Grandfather        MI    Social History   Socioeconomic History   Marital status: Married    Spouse name: Not on file   Number of children: Not on file   Years of education: Not on file   Highest education level: Not on file  Occupational History   Not on file  Tobacco Use   Smoking status: Never   Smokeless tobacco: Never  Vaping Use   Vaping Use: Never used  Substance and Sexual Activity   Alcohol use: No    Alcohol/week: 0.0 standard drinks   Drug use: No   Sexual activity: Yes    Birth control/protection: Pill  Other Topics Concern   Not on file  Social History Narrative   Not on file   Social Determinants of Health   Financial Resource Strain: Not on file  Food Insecurity: Not on file  Transportation Needs: Not on file  Physical Activity: Not on file  Stress: Not on file  Social Connections: Not on file  Intimate Partner Violence: Not on file     Constitutional: Denies fever, malaise, fatigue, headache or abrupt  weight changes.  Respiratory: Denies difficulty breathing, shortness of breath, cough or sputum production.   Cardiovascular: Denies chest pain, chest tightness, palpitations or swelling in the hands or feet.  Gastrointestinal: Denies abdominal pain, bloating, constipation, diarrhea or blood in the stool.  GU: Denies urgency, frequency, pain with urination, burning sensation, blood in urine, odor or discharge. Skin: Denies redness, rashes, lesions or ulcercations.    No other specific complaints in a complete review of systems (except as listed in HPI above).  Objective:   Physical Exam   BP 110/62 (BP Location: Right Arm, Patient Position: Sitting, Cuff Size: Normal)    Pulse 67    Temp (!) 97.5 F (36.4 C) (Temporal)    Wt 153 lb (69.4 kg)    LMP 03/06/2021 (Approximate)    SpO2 100%    BMI 26.26 kg/m   Wt Readings from Last 3 Encounters:  03/15/21 153 lb 9.6 oz (69.7 kg)  08/06/17 158 lb (71.7 kg)  07/16/17 158 lb (71.7 kg)    General: Appears her stated age, well developed, well nourished in NAD. Skin: Warm, dry and intact. No rashes, lesions or ulcerations noted. Cardiovascular: Normal rate. Pulmonary/Chest:  Normal effort. Pelvic: Normal female anatomy. Cervix without changes. Small amount of thick white discharge noted. No CMT. Adnexa non palpable. Neurological: Alert and oriented.   BMET    Component Value Date/Time   NA 138 03/15/2021 1426   K 4.5 03/15/2021 1426   CL 105 03/15/2021 1426   CO2 30 03/15/2021 1426   GLUCOSE 84 03/15/2021 1426   BUN 12 03/15/2021 1426   CREATININE 1.03 (H) 03/15/2021 1426   CALCIUM 9.3 03/15/2021 1426    Lipid Panel     Component Value Date/Time   CHOL 171 03/15/2021 1426   TRIG 126 03/15/2021 1426   HDL 42 (L) 03/15/2021 1426   CHOLHDL 4.1 03/15/2021 1426   LDLCALC 106 (H) 03/15/2021 1426    CBC    Component Value Date/Time   WBC 7.7 03/15/2021 1426   RBC 4.27 03/15/2021 1426   HGB 13.2 03/15/2021 1426   HCT 39.2  03/15/2021 1426   PLT 342 03/15/2021 1426   MCV 91.8 03/15/2021 1426   MCH 30.9 03/15/2021 1426   MCHC 33.7 03/15/2021 1426   RDW 12.1 03/15/2021 1426    Hgb A1C Lab Results  Component Value Date   HGBA1C 5.2 03/15/2021          Assessment & Plan:   Screening for Cervical Cancer with Routine GYN Exam:  Pap smear today, she declines STD screening  RTC in 1 year, sooner if needed Nicki Reaper, NP This visit occurred during the SARS-CoV-2 public health emergency.  Safety protocols were in place, including screening questions prior to the visit, additional usage of staff PPE, and extensive cleaning of exam room while observing appropriate contact time as indicated for disinfecting solutions.

## 2021-03-30 LAB — CYTOLOGY - PAP
Adequacy: ABSENT
Diagnosis: NEGATIVE

## 2021-05-16 ENCOUNTER — Ambulatory Visit: Payer: Self-pay | Admitting: Internal Medicine

## 2021-11-24 ENCOUNTER — Telehealth: Payer: BC Managed Care – PPO | Admitting: Internal Medicine

## 2021-11-24 ENCOUNTER — Ambulatory Visit: Payer: Self-pay

## 2021-11-24 NOTE — Progress Notes (Deleted)
Virtual Visit via Video Note  I connected with Maria Bartlett on 11/24/21 at 11:20 AM EDT by a video enabled telemedicine application and verified that I am speaking with the correct person using two identifiers.  Location: Patient: *** Provider: Office  Person's participating in this video call: [employer] and [employer].   I discussed the limitations of evaluation and management by telemedicine and the availability of in person appointments. The patient expressed understanding and agreed to proceed.  History of Present Illness:  Patient reports sore throat.  This started  Past Medical History:  Diagnosis Date   Asthma     No current outpatient medications on file.   No current facility-administered medications for this visit.    Allergies  Allergen Reactions   Other    Pollen Extract     Family History  Problem Relation Age of Onset   Cancer Father        prostate   Hyperlipidemia Father    Heart disease Father    Arthritis Father    Hypertension Mother    Arthritis Mother    Meniere's disease Mother    Hyperlipidemia Mother    Endometriosis Mother    Osteoporosis Maternal Grandmother    Arthritis Maternal Grandmother    Stroke Maternal Grandfather    Diabetes Paternal Grandfather    Heart disease Paternal Grandfather        MI    Social History   Socioeconomic History   Marital status: Married    Spouse name: Not on file   Number of children: Not on file   Years of education: Not on file   Highest education level: Not on file  Occupational History   Not on file  Tobacco Use   Smoking status: Never   Smokeless tobacco: Never  Vaping Use   Vaping Use: Never used  Substance and Sexual Activity   Alcohol use: No    Alcohol/week: 0.0 standard drinks of alcohol   Drug use: No   Sexual activity: Yes    Birth control/protection: Pill  Other Topics Concern   Not on file  Social History Narrative   Not on file   Social Determinants of Health    Financial Resource Strain: Not on file  Food Insecurity: Not on file  Transportation Needs: Not on file  Physical Activity: Not on file  Stress: Not on file  Social Connections: Not on file  Intimate Partner Violence: Not on file     Constitutional: Denies fever, malaise, fatigue, headache or abrupt weight changes.  HEENT: Pt reports sore throat. Denies eye pain, eye redness, ear pain, ringing in the ears, wax buildup, runny nose, nasal congestion, bloody nose. Respiratory: Denies difficulty breathing, shortness of breath, cough or sputum production.   Cardiovascular: Denies chest pain, chest tightness, palpitations or swelling in the hands or feet.  Gastrointestinal: Denies abdominal pain, bloating, constipation, diarrhea or blood in the stool.  GU: Denies urgency, frequency, pain with urination, burning sensation, blood in urine, odor or discharge. Musculoskeletal: Denies decrease in range of motion, difficulty with gait, muscle pain or joint pain and swelling.  Skin: Denies redness, rashes, lesions or ulcercations.  Neurological: Denies dizziness, difficulty with memory, difficulty with speech or problems with balance and coordination.  Psych: Denies anxiety, depression, SI/HI.  No other specific complaints in a complete review of systems (except as listed in HPI above).  Observations/Objective:  There were no vitals taken for this visit. Wt Readings from Last 3 Encounters:  03/25/21 153 lb (69.4 kg)  03/15/21 153 lb 9.6 oz (69.7 kg)  08/06/17 158 lb (71.7 kg)    General: Appears her stated age, well developed, well nourished in NAD. HEENT: Nose: ; Throat/Mouth:  Pulmonary/Chest: Normal effort. No respiratory distress.  Neurological: Alert and oriented.   BMET    Component Value Date/Time   NA 138 03/15/2021 1426   K 4.5 03/15/2021 1426   CL 105 03/15/2021 1426   CO2 30 03/15/2021 1426   GLUCOSE 84 03/15/2021 1426   BUN 12 03/15/2021 1426   CREATININE 1.03 (H)  03/15/2021 1426   CALCIUM 9.3 03/15/2021 1426    Lipid Panel     Component Value Date/Time   CHOL 171 03/15/2021 1426   TRIG 126 03/15/2021 1426   HDL 42 (L) 03/15/2021 1426   CHOLHDL 4.1 03/15/2021 1426   LDLCALC 106 (H) 03/15/2021 1426    CBC    Component Value Date/Time   WBC 7.7 03/15/2021 1426   RBC 4.27 03/15/2021 1426   HGB 13.2 03/15/2021 1426   HCT 39.2 03/15/2021 1426   PLT 342 03/15/2021 1426   MCV 91.8 03/15/2021 1426   MCH 30.9 03/15/2021 1426   MCHC 33.7 03/15/2021 1426   RDW 12.1 03/15/2021 1426    Hgb A1C Lab Results  Component Value Date   HGBA1C 5.2 03/15/2021       Assessment and Plan:  RTC in 3 months for annual exam Follow Up Instructions:    I discussed the assessment and treatment plan with the patient. The patient was provided an opportunity to ask questions and all were answered. The patient agreed with the plan and demonstrated an understanding of the instructions.   The patient was advised to call back or seek an in-person evaluation if the symptoms worsen or if the condition fails to improve as anticipated.   Nicki Reaper, NP

## 2021-11-24 NOTE — Telephone Encounter (Signed)
Chief Complaint: severe sore throat Symptoms: prod cough, nasal congestion, hoarse Frequency: Monday Pertinent Negatives: Patient denies SOB, dehydration, fever Disposition: [] ED /[] Urgent Care (no appt availability in office) / [x] Appointment(In office/virtual)/ []  [facility]/ [] Home Care/ [] Refused Recommended Disposition /[]  Mobile Bus/ []  Follow-up with PCP Additional Notes: not interested in Paxlovid Reason for Disposition  [1] COVID-19 diagnosed by positive lab test (e.g., PCR, rapid self-test kit) AND [2] mild symptoms (e.g., cough, fever, others) AND [4] no complications or SOB  SEVERE (e.g., excruciating) throat pain  Answer Assessment - Initial Assessment Questions 1. COVID-19 DIAGNOSIS: "How do you know that you have COVID?" (e.g., positive lab test or self-test, diagnosed by doctor or NP/PA, symptoms after exposure).     Self  test 2. COVID-19 EXPOSURE: "Was there any known exposure to COVID before the symptoms began?" CDC Definition of close contact: within 6 feet (2 meters) for a total of 15 minutes or more over a 24-hour period.      no 3. ONSET: "When did the COVID-19 symptoms start?"      Monday 4. WORST SYMPTOM: "What is your worst symptom?" (e.g., cough, fever, shortness of breath, muscle aches)     Sore throat 5. COUGH: "Do you have a cough?" If Yes, ask: "How bad is the cough?"       cough 6. FEVER: "Do you have a fever?" If Yes, ask: "What is your temperature, how was it measured, and when did it start?"     no 7. RESPIRATORY STATUS: "Describe your breathing?" (e.g., normal; shortness of breath, wheezing, unable to speak)      normal 8. BETTER-SAME-WORSE: "Are you getting better, staying the same or getting worse compared to yesterday?"  If getting worse, ask, "In what way?"     Worse d/t throat 9. OTHER SYMPTOMS: "Do you have any other symptoms?"  (e.g., chills, fatigue, headache, loss of smell or taste, muscle pain, sore throat)     Sore  throat, hoarse, cough, nasal congestion 10. HIGH RISK DISEASE: "Do you have any chronic medical problems?" (e.g., asthma, heart or lung disease, weak immune system, obesity, etc.)       no 11. VACCINE: "Have you had the COVID-19 vaccine?" If Yes, ask: "Which one, how many shots, when did you get it?"       no 12. PREGNANCY: "Is there any chance you are pregnant?" "When was your last menstrual period?"       N/a 13. O2 SATURATION MONITOR:  "Do you use an oxygen saturation monitor (pulse oximeter) at home?" If Yes, ask "What is your reading (oxygen level) today?" "What is your usual oxygen saturation reading?" (e.g., 95%)       N/a  Answer Assessment - Initial Assessment Questions 1. ONSET: "When did the throat start hurting?" (Hours or days ago)      Monday 2. SEVERITY: "How bad is the sore throat?" (Scale 1-10; mild, moderate or severe)   - MILD (1-3):  Doesn't interfere with eating or normal activities.   - MODERATE (4-7): Interferes with eating some solids and normal activities.   - SEVERE (8-10):  Excruciating pain, interferes with most normal activities.   - SEVERE WITH DYSPHAGIA (10): Can't swallow liquids, drooling.     severe 3. STREP EXPOSURE: "Has there been any exposure to strep within the past week?" If Yes, ask: "What type of contact occurred?"      no 4.  VIRAL SYMPTOMS: "Are there any symptoms of a cold, such as  a runny nose, cough, hoarse voice or red eyes?"      yes 5. FEVER: "Do you have a fever?" If Yes, ask: "What is your temperature, how was it measured, and when did it start?"     no 6. PUS ON THE TONSILS: "Is there pus on the tonsils in the back of your throat?"     no 7. OTHER SYMPTOMS: "Do you have any other symptoms?" (e.g., difficulty breathing, headache, rash)     no 8. PREGNANCY: "Is there any chance you are pregnant?" "When was your last menstrual period?"     N/a  Protocols used: Coronavirus (COVID-19) Diagnosed or Suspected-A-AH, Sore Throat-A-AH
